# Patient Record
Sex: Female | Born: 1965 | Race: White | Hispanic: No | Marital: Married | Smoking: Never smoker
Health system: Southern US, Community
[De-identification: ages and names within clinical notes are randomized; demographics above are authoritative.]

---

## 2008-03-29 ENCOUNTER — Ambulatory Visit: Payer: Self-pay | Admitting: Occupational Medicine

## 2017-02-09 ENCOUNTER — Encounter: Payer: Self-pay | Admitting: Emergency Medicine

## 2017-02-09 ENCOUNTER — Emergency Department (INDEPENDENT_AMBULATORY_CARE_PROVIDER_SITE_OTHER): Payer: Managed Care, Other (non HMO)

## 2017-02-09 ENCOUNTER — Emergency Department (INDEPENDENT_AMBULATORY_CARE_PROVIDER_SITE_OTHER)
Admission: EM | Admit: 2017-02-09 | Discharge: 2017-02-09 | Disposition: A | Discharge status: Home / Self Care | Payer: Self-pay | Source: Home / Self Care | Attending: Family Medicine | Admitting: Family Medicine

## 2017-02-09 DIAGNOSIS — M546 Pain in thoracic spine: Secondary | ICD-10-CM

## 2017-02-09 DIAGNOSIS — M50321 Other cervical disc degeneration at C4-C5 level: Secondary | ICD-10-CM

## 2017-02-09 DIAGNOSIS — S161XXA Strain of muscle, fascia and tendon at neck level, initial encounter: Secondary | ICD-10-CM

## 2017-02-09 MED ORDER — CYCLOBENZAPRINE HCL 10 MG PO TABS: 10.0000 mg | ORAL_TABLET | Freq: Every day | ORAL | 0 refills | Status: AC

## 2017-02-09 NOTE — Discharge Instructions (Signed)
Apply ice pack for 20 to 30 minutes, 3 to 4 times daily  Continue until pain and swelling decrease. Wear soft cervical collar until pain has decreased.  Begin neck range of motion and stretching exercises as tolerated.  May take Ibuprofen 200mg , 4 tabs every 8 hours with food.
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
## 2022-03-04 ENCOUNTER — Other Ambulatory Visit: Payer: Self-pay

## 2022-03-04 ENCOUNTER — Ambulatory Visit
Admission: EM | Admit: 2022-03-04 | Discharge: 2022-03-04 | Disposition: A | Discharge status: Home / Self Care | Payer: 59 | Attending: Family Medicine | Admitting: Family Medicine

## 2022-03-04 DIAGNOSIS — Z792 Long term (current) use of antibiotics: Secondary | ICD-10-CM | POA: Insufficient documentation

## 2022-03-04 DIAGNOSIS — N3001 Acute cystitis with hematuria: Secondary | ICD-10-CM | POA: Insufficient documentation

## 2022-03-04 DIAGNOSIS — R319 Hematuria, unspecified: Secondary | ICD-10-CM | POA: Diagnosis present

## 2022-03-04 LAB — POCT URINALYSIS DIP (MANUAL ENTRY)
Bilirubin, UA: NEGATIVE
Glucose, UA: NEGATIVE mg/dL
Ketones, POC UA: NEGATIVE mg/dL
Nitrite, UA: NEGATIVE
Spec Grav, UA: >=1.03 — AB (ref 1.010–1.025)
Urobilinogen, UA: 0.2 E.U./dL
pH, UA: 5.5 (ref 5.0–8.0)

## 2022-03-04 MED ORDER — SULFAMETHOXAZOLE-TRIMETHOPRIM 800-160 MG PO TABS: 1.0000 | ORAL_TABLET | Freq: Two times a day (BID) | ORAL | 0 refills | Status: AC

## 2022-03-04 NOTE — Discharge Instructions (Addendum)
Advised patient to take medication as directed with food to completion.  Encouraged patient to increase daily water intake 64 ounces per day while taking this medication.  Advised we will follow-up with urine culture results once received.

## 2022-03-04 NOTE — ED Provider Notes (Signed)
[facility]    CSN: [id number] Arrival date & time: 03/04/22  1032      History   Chief Complaint Chief Complaint  Patient presents with   Hematuria    HPI Haley Horne is a 56 y.o. female.   HPI 56 year old female left lower back pain and blood in urine for 6 days. PMH significant for thyroid disease and depression  History reviewed. No pertinent past medical history.  There are no problems to display for this patient.   History reviewed. No pertinent surgical history.  OB History   No obstetric history on file.      Home Medications    Prior to Admission medications   Medication Sig Start Date End Date Taking? Authorizing Provider  NP THYROID 90 MG tablet Take 90 mg by mouth daily. 01/29/22  Yes [provider]  sertraline (ZOLOFT) 100 MG tablet Take 100 mg by mouth daily. 11/06/21  Yes [provider]  sulfamethoxazole-trimethoprim (BACTRIM DS) 800-160 MG tablet Take 1 tablet by mouth 2 (two) times daily for 7 days. 03/04/22 03/11/22 Yes Trevor Iha, FNP  cyclobenzaprine (FLEXERIL) 10 MG tablet Take 1 tablet (10 mg total) by mouth at bedtime. 02/09/17   Lattie Haw, MD  MIN[employer]INE[employer] by Combination route.    [provider]  PROGESTERONE MICRONIZED TD Take by mouth.    [provider]    Family History History reviewed. No pertinent family history.  Social History Social History   Tobacco Use   Smoking status: Never   Smokeless tobacco: Never  Substance Use Topics   Alcohol use: No   Drug use: No     Allergies   Penicillins   Review of Systems Review of Systems  Genitourinary:  Positive for hematuria.  Musculoskeletal:  Positive for back pain.     Physical Exam Triage Vital Signs ED Triage Vitals  Enc Vitals Group     BP 03/04/22 1117 99/63     Pulse Rate 03/04/22 1117 63     Resp 03/04/22 1117 16     Temp 03/04/22 1117 97.6 F (36.4 C)     Temp Source  03/04/22 1117 Oral     SpO2 03/04/22 1117 99 %     Weight 03/04/22 1119 134 lb (60.8 kg)     Height 03/04/22 1119 5\' 4"  (1.626 m)     Head Circumference --      Peak Flow --      Pain Score 03/04/22 1119 4     Pain Loc --      Pain Edu? --      Excl. in GC? --    No data found.  Updated Vital Signs BP 99/63 (BP Location: Right Arm)   Pulse 63   Temp 97.6 F (36.4 C) (Oral)   Resp 16   Ht 5\' 4"  (1.626 m)   Wt 134 lb (60.8 kg)   SpO2 99%   BMI 23.00 kg/m      Physical Exam Vitals and nursing note reviewed.  Constitutional:      Appearance: Normal appearance. She is normal weight.  HENT:     Head: Normocephalic and atraumatic.     Right Ear: Tympanic membrane, ear canal and external ear normal.     Left Ear: Tympanic membrane, ear canal and external ear normal.     Mouth/Throat:     Mouth: Mucous membranes are moist.     Pharynx: Oropharynx is clear.  Eyes:  Extraocular Movements: Extraocular movements intact.     Conjunctiva/sclera: Conjunctivae normal.     Pupils: Pupils are equal, round, and reactive to light.  Cardiovascular:     Rate and Rhythm: Normal rate and regular rhythm.     Pulses: Normal pulses.     Heart sounds: Normal heart sounds.  Pulmonary:     Effort: Pulmonary effort is normal.     Breath sounds: Normal breath sounds.  Musculoskeletal:        General: Normal range of motion.     Cervical back: Normal range of motion and neck supple.  Skin:    General: Skin is warm and dry.  Neurological:     General: No focal deficit present.     Mental Status: She is alert and oriented to person, place, and time.      UC Treatments / Results  Labs (all labs ordered are listed, but only abnormal results are displayed) Labs Reviewed  POCT URINALYSIS DIP (MANUAL ENTRY) - Abnormal; Notable for the following components:      Result Value   Spec Grav, UA >=1.030 (*)    Blood, UA large (*)    Protein Ur, POC trace (*)    Leukocytes, UA Small (1+) (*)     All other components within normal limits  URINE CULTURE  POCT URINALYSIS DIP (MANUAL ENTRY)    EKG   Radiology No results found.  Procedures Procedures (including critical care time)  Medications Ordered in UC Medications - No data to display  Initial Impression / Assessment and Plan / UC Course  I have reviewed the triage vital signs and the nursing notes.  Pertinent labs & imaging results that were available during my care of the patient were reviewed by me and considered in my medical decision making (see chart for details).     MDM: 1.  Acute cystitis with hematuria-Rx'd Bactrim. Advised patient to take medication as directed with food to completion.  Encouraged patient to increase daily water intake 64 ounces per day while taking this medication.  Advised we will follow-up with urine culture results once received.  Patient discharged home, hemodynamically stable. Final Clinical Impressions(s) / UC Diagnoses   Final diagnoses:  Acute cystitis with hematuria     Discharge Instructions      Advised patient to take medication as directed with food to completion.  Encouraged patient to increase daily water intake 64 ounces per day while taking this medication.  Advised we will follow-up with urine culture results once received.     ED Prescriptions     Medication Sig Dispense Auth. Provider   sulfamethoxazole-trimethoprim (BACTRIM DS) 800-160 MG tablet Take 1 tablet by mouth 2 (two) times daily for 7 days. 14 tablet Trevor Iha, FNP      PDMP not reviewed this encounter.   Trevor Iha, FNP 03/04/22 1233

## 2022-03-04 NOTE — ED Triage Notes (Signed)
Left lower back pain and blood in urine onset since Monday.

## 2022-03-05 LAB — URINE CULTURE: Culture: 40000 — AB

## 2022-03-06 LAB — URINE CULTURE

## 2023-10-29 ENCOUNTER — Other Ambulatory Visit: Payer: Self-pay | Admitting: Medical Genetics

## 2023-11-19 ENCOUNTER — Other Ambulatory Visit (HOSPITAL_COMMUNITY)
Admission: RE | Admit: 2023-11-19 | Discharge: 2023-11-19 | Disposition: A | Discharge status: Home / Self Care | Payer: Self-pay | Source: Ambulatory Visit | Attending: Medical Genetics | Admitting: Medical Genetics

## 2023-11-29 LAB — GENECONNECT MOLECULAR SCREEN: Genetic Analysis Overall Interpretation: NEGATIVE
